# Patient Record
Sex: Female | Born: 1965 | Marital: Married | Smoking: Never smoker
Health system: Southern US, Community
[De-identification: ages and names within clinical notes are randomized; demographics above are authoritative.]

## PROBLEM LIST (undated history)

## (undated) HISTORY — PX: TOTAL HIP ARTHROPLASTY: SHX124

---

## 2018-07-13 ENCOUNTER — Other Ambulatory Visit: Payer: Self-pay | Admitting: Obstetrics and Gynecology

## 2018-07-13 DIAGNOSIS — R928 Other abnormal and inconclusive findings on diagnostic imaging of breast: Secondary | ICD-10-CM

## 2018-08-10 ENCOUNTER — Ambulatory Visit
Admission: RE | Admit: 2018-08-10 | Discharge: 2018-08-10 | Disposition: A | Discharge status: Home / Self Care | Payer: BLUE CROSS/BLUE SHIELD | Source: Ambulatory Visit | Attending: Obstetrics and Gynecology | Admitting: Obstetrics and Gynecology

## 2018-08-10 ENCOUNTER — Encounter (INDEPENDENT_AMBULATORY_CARE_PROVIDER_SITE_OTHER): Payer: Self-pay

## 2018-08-10 DIAGNOSIS — R928 Other abnormal and inconclusive findings on diagnostic imaging of breast: Secondary | ICD-10-CM

## 2018-08-16 ENCOUNTER — Encounter: Payer: Self-pay | Admitting: Registered"

## 2018-08-16 ENCOUNTER — Encounter: Payer: BLUE CROSS/BLUE SHIELD | Attending: Obstetrics and Gynecology | Admitting: Registered"

## 2018-08-16 DIAGNOSIS — R635 Abnormal weight gain: Secondary | ICD-10-CM | POA: Diagnosis present

## 2018-08-16 NOTE — Progress Notes (Signed)
Medical Nutrition Therapy:  Appt start time: 11:17 end time:  12:19.  Assessment:  Primary concerns today:  Pt expectations: direction/road map of what she should be expecting  Pt states she cannot seem to stop gaining weight and has not been able to lose weight. 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 Pt states she has been gaining around 10 lbs a year. Pt states gaining weight is making her feel depressed. Pt states she does not like the way she looks, currently uncomfortable with her physical appearance and how her body is changing. Pt states gaining weight makes her feel defeated and like the fun is over.   Pt states she has already eliminated things from her diet, reducing coffee intake, as well as alcoholic beverage intake. Pt states she has always been a healthy eater, does not eat a lot of added sugar or drink sodas. Pt states she eats fruits and vegetables all day. Pt states she does not eat out often. Pt states she prefers Ezekiel bread due to texture; other breads are like a sponge. Pt states she finds herself eating less and less meat. Pt reports she does not cook anymore due to husband still living in [place] and she's only preparing meals for herself. Pt states she is not eating things like potatoes or fried foods. Pt states she likes potato chips but does not buy them because she will indulge and eat more than half the bag.   Preferred Learning Style:   No preference indicated   Learning Readiness:   Ready  Change in progress   MEDICATIONS: See list   DIETARY INTAKE:  Usual eating pattern includes 3 meals and 2 snacks per day.  Everyday foods include salads, .  Avoided foods include processed foods, .    24-hr recall:  B ( AM): coffee with half and half + grapes  Snk ( AM): banana  L ( PM): overnight oats + almond milk + dried cranberries + chia seeds or Mexican restaurant-taco + rice + beans Snk (  PM): greek yogurt D ( PM): salad + grilled chicken + beets + goat cheese + seeds + vanaigrette + Ezekiel bread (only) + almond butter or chicken sausage + zucchini + squash Snk ( PM): none Beverages: sparkling water or plain water (40-60 oz), 1-2 wine/week  Usual physical activity: yoga,barre, body pump, spin class 4-5 times/week + hiking  Pt seems to be averaging ~1400-1600 calories based on dietary recall and needs to increase due to activity level.   Estimated energy needs: 2000-2200 calories 225-248 g carbohydrates 150-165 g protein 56-61 g fat  Progress Towards Goal(s):  In progress.   Nutritional Diagnosis:  NB-1.7 Undesireable food choices As related to balanced meals.  As evidenced by dietary recall.    Intervention:  Nutrition education and counseling. Pt was educated and counseled on benefits of having a variety of food groups throughout the day, good vs bad foods, diet culture, metabolism, the importance of fueling the body adequately, and health. Pt was also counseled on the importance of mental health and balancing stress. Pt was educated on eating disorders and how they may present themselves. Pt was in agreement with goals listed.  Goals: - Aim to have protein with fruit throughout the day. - Aim to add protein to breakfast.  - Aim to have carbohydrates, fruit/vegetables, grains, and protein with lunch and dinner.  - Aim to have increase water intake. Keep water bottle  at work visibile and sip throughout the day.  - Look into mental health resources.   Teaching Method Utilized:  Visual Auditory Hands on  Handouts given during visit include:  none  Barriers to learning/adherence to lifestyle change: contemplative stage of change  Demonstrated degree of understanding via:  Teach Back   Monitoring/Evaluation:  Dietary intake, exercise, and body weight prn.

## 2018-08-16 NOTE — Patient Instructions (Addendum)
-   Aim to have protein with fruit throughout the day.  - Aim to add protein to breakfast.   - Aim to have carbohydrates, fruit/vegetables, grains, and protein with lunch and dinner.   - Aim to have increase water intake. Keep water bottle at work visibile and sip throughout the day.   - Look into mental health resources.

## 2019-09-09 ENCOUNTER — Emergency Department (HOSPITAL_BASED_OUTPATIENT_CLINIC_OR_DEPARTMENT_OTHER): Payer: BC Managed Care – PPO

## 2019-09-09 ENCOUNTER — Other Ambulatory Visit: Payer: Self-pay

## 2019-09-09 ENCOUNTER — Encounter (HOSPITAL_BASED_OUTPATIENT_CLINIC_OR_DEPARTMENT_OTHER): Payer: Self-pay | Admitting: *Deleted

## 2019-09-09 ENCOUNTER — Emergency Department (HOSPITAL_BASED_OUTPATIENT_CLINIC_OR_DEPARTMENT_OTHER)
Admission: EM | Admit: 2019-09-09 | Discharge: 2019-09-09 | Disposition: A | Discharge status: Home / Self Care | Payer: BC Managed Care – PPO | Attending: Emergency Medicine | Admitting: Emergency Medicine

## 2019-09-09 DIAGNOSIS — Y9389 Activity, other specified: Secondary | ICD-10-CM | POA: Diagnosis not present

## 2019-09-09 DIAGNOSIS — W2201XA Walked into wall, initial encounter: Secondary | ICD-10-CM | POA: Insufficient documentation

## 2019-09-09 DIAGNOSIS — Y9289 Other specified places as the place of occurrence of the external cause: Secondary | ICD-10-CM | POA: Diagnosis not present

## 2019-09-09 DIAGNOSIS — S99921A Unspecified injury of right foot, initial encounter: Secondary | ICD-10-CM | POA: Diagnosis present

## 2019-09-09 DIAGNOSIS — S92514A Nondisplaced fracture of proximal phalanx of right lesser toe(s), initial encounter for closed fracture: Secondary | ICD-10-CM | POA: Insufficient documentation

## 2019-09-09 DIAGNOSIS — Y999 Unspecified external cause status: Secondary | ICD-10-CM | POA: Diagnosis not present

## 2019-09-09 DIAGNOSIS — S92501A Displaced unspecified fracture of right lesser toe(s), initial encounter for closed fracture: Secondary | ICD-10-CM

## 2019-09-09 DIAGNOSIS — Z79899 Other long term (current) drug therapy: Secondary | ICD-10-CM | POA: Insufficient documentation

## 2019-09-09 MED ORDER — HYDROCODONE-ACETAMINOPHEN 5-325 MG PO TABS
1.0000 | ORAL_TABLET | Freq: Once | ORAL | Status: AC
  Administered 2019-09-09: 1 via ORAL
  Filled 2019-09-09: qty 1

## 2019-09-09 NOTE — Discharge Instructions (Signed)
Please read and follow all provided instructions.  Your diagnoses today include:  1. Closed fracture of phalanx of right fifth toe, initial encounter     Tests performed today include:  An x-ray of the affected area - shows broken toe  Vital signs. See below for your results today.   Medications prescribed:  Please use over-the-counter NSAID medications (ibuprofen, naproxen) as directed on the packaging for pain.   Take any prescribed medications only as directed.  Home care instructions:   Follow any educational materials contained in this packet  Follow R.I.C.E. Protocol:  R - rest your injury   I  - use ice on injury without applying directly to skin  C - compress injury with bandage or splint  E - elevate the injury as much as possible  Follow-up instructions: Please follow-up with your primary care provider or the provided podiatrist if you have any concerns about the function of your toe after healing.  Return instructions:   Please return if your toes or feet are numb or tingling, appear gray or blue, or you have severe pain (also elevate the leg and loosen splint or wrap if you were given one)  Please return to the Emergency Department if you experience worsening symptoms.   Please return if you have any other emergent concerns.  Additional Information:  Your vital signs today were: BP (!) 146/88 (BP Location: Right Arm)   Pulse 89   Temp 99.3 F (37.4 C) (Oral)   Resp 16   Ht 5\' 7"  (1.702 m)   Wt 72.6 kg   SpO2 99%   BMI 25.06 kg/m  If your blood pressure (BP) was elevated above 135/85 this visit, please have this repeated by your doctor within one month. --------------

## 2019-09-09 NOTE — ED Provider Notes (Signed)
[facility] EMERGENCY DEPARTMENT Provider Note   CSN: [id number] Arrival date & time: 09/09/19  2051     History Chief Complaint  Patient presents with  . Toe Injury    Cynthia Preston is a 54 y.o. female.  Patient presents to the emergency department with acute onset of right fifth toe pain starting acutely just prior to arrival.  Patient states that she stubbed her toe on a wall.  She noted that her toe was displaced towards the outside of the foot.  No treatments prior to arrival other than taping.  She denies other injuries.  She has swelling of her foot that is associated with her injury.  Pain is worse with movement or palpation.        History reviewed. No pertinent past medical history.  There are no problems to display for this patient.   Past Surgical History:  Procedure Laterality Date  . TOTAL HIP ARTHROPLASTY Right      OB History   No obstetric history on file.     Family History  Problem Relation Age of Onset  . Hypertension Other     Social History   Tobacco Use  . Smoking status: Never Smoker  . Smokeless tobacco: Never Used  Substance Use Topics  . Alcohol use: Yes  . Drug use: Never    Home Medications Prior to Admission medications   Medication Sig Start Date End Date Taking? Authorizing Provider  calcium acetate (PHOSLO) 667 MG capsule Take by mouth 3 (three) times daily with meals.    [provider]  cholecalciferol (VITAMIN D3) 25 MCG (1000 UT) tablet Take 1,000 Units by mouth daily.    [provider]    Allergies    Patient has no known allergies.  Review of Systems   Review of Systems  Constitutional: Negative for activity change.  Musculoskeletal: Positive for arthralgias and joint swelling. Negative for back pain and neck pain.  Skin: Negative for wound.  Neurological: Negative for weakness and numbness.    Physical Exam Updated Vital Signs BP (!) 146/88 (BP Location: Right Arm)   Pulse 89    Temp 99.3 F (37.4 C) (Oral)   Resp 16   Ht 5\' 7"  (1.702 m)   Wt 72.6 kg   SpO2 99%   BMI 25.06 kg/m   Physical Exam Vitals and nursing note reviewed.  Constitutional:      Appearance: She is well-developed.  HENT:     Head: Normocephalic and atraumatic.  Eyes:     Pupils: Pupils are equal, round, and reactive to light.  Cardiovascular:     Pulses: Normal pulses. No decreased pulses.  Musculoskeletal:        General: Tenderness present.     Cervical back: Normal range of motion and neck supple.     Comments: Patient with tenderness and swelling of the right fifth little toe with minor swelling on the forefoot.  Toe appears to be intact vascularly with good color and perfusion.  Skin:    General: Skin is warm and dry.  Neurological:     Mental Status: She is alert.     Sensory: No sensory deficit.     Comments: Motor, sensation, and vascular distal to the injury is fully intact.      ED Results / Procedures / Treatments   Labs (all labs ordered are listed, but only abnormal results are displayed) Labs Reviewed - No data to display  EKG None  Radiology DG Foot  Complete Right  Result Date: 09/09/2019 CLINICAL DATA:  Pinky toe injury. EXAM: RIGHT FOOT COMPLETE - 3+ VIEW COMPARISON:  None. FINDINGS: There is an acute fracture involving the proximal phalanx of the fifth digit. There is surrounding soft tissue swelling. There is no dislocation. IMPRESSION: Acute fracture involving the proximal phalanx of the fifth digit. Electronically Signed   By: Constance Holster M.D.   On: 09/09/2019 21:25    Procedures Procedures (including critical care time)  Medications Ordered in ED Medications  HYDROcodone-acetaminophen (NORCO/VICODIN) 5-325 MG per tablet 1 tablet (1 tablet Oral Given 09/09/19 2200)    ED Course  I have reviewed the triage vital signs and the nursing notes.  Pertinent labs & imaging results that were available during my care of the patient were reviewed by  me and considered in my medical decision making (see chart for details).  Patient seen and examined.  X-ray reviewed.  Patient requesting pain medication.  1 Vicodin ordered for treatment in the ED.  Vital signs reviewed and are as follows: BP (!) 146/88 (BP Location: Right Arm)   Pulse 89   Temp 99.3 F (37.4 C) (Oral)   Resp 16   Ht 5\' 7"  (1.702 m)   Wt 72.6 kg   SpO2 99%   BMI 25.06 kg/m   She has a proximal phalanx fracture.  She will be buddy taped and given a hard sole shoe.  Discussed rice protocol.  Discussed podiatry follow-up if she has any issues with function after the toe is healing.     MDM Rules/Calculators/A&P                      Toe fracture, closed.  No indications for emergent orthopedic consultation.  Final Clinical Impression(s) / ED Diagnoses Final diagnoses:  Closed fracture of phalanx of right fifth toe, initial encounter    Rx / DC Orders ED Discharge Orders    None       Carlisle Cater, PA-C 09/09/19 2212    Malvin Johns, MD 09/09/19 2222

## 2019-09-09 NOTE — ED Triage Notes (Signed)
Pt states she stubbed her right pinky toe on a wall. +deformity

## 2019-12-29 IMAGING — MG DIGITAL DIAGNOSTIC UNILATERAL LEFT MAMMOGRAM WITH TOMO AND CAD
6 series · 6 of 18 positions shown · non-contrast
Comparison: 07/12/2018

CLINICAL DATA: The patient returns after baseline screening for
evaluation of a possible mass and possible asymmetry in the LEFT
breast.

EXAM:
DIGITAL DIAGNOSTIC LEFT MAMMOGRAM WITH CAD AND TOMO
ULTRASOUND LEFT BREAST

[L MLO synth-2D]
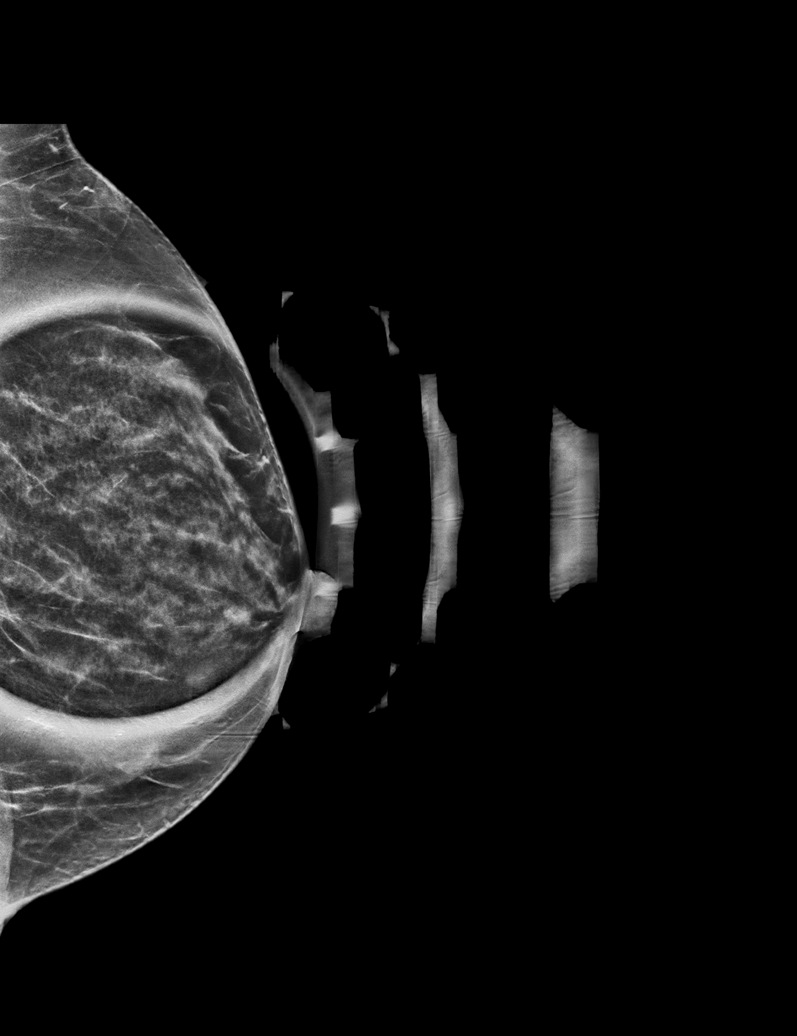

[L ML synth-2D]
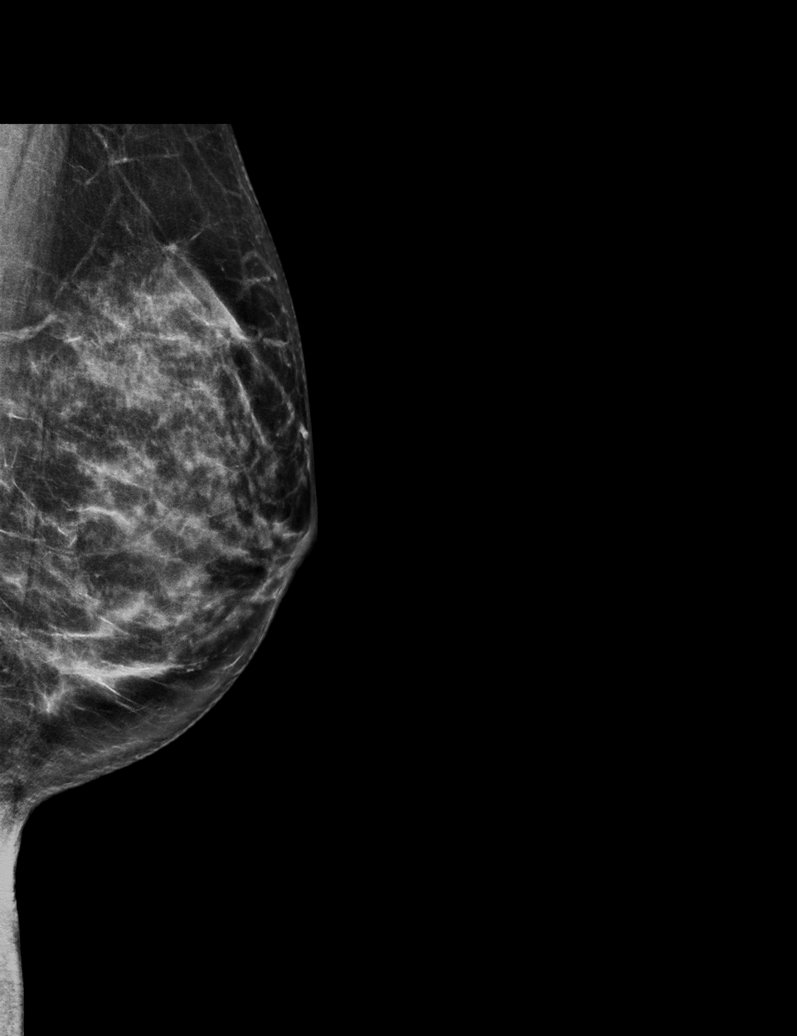

[L CC synth-2D]
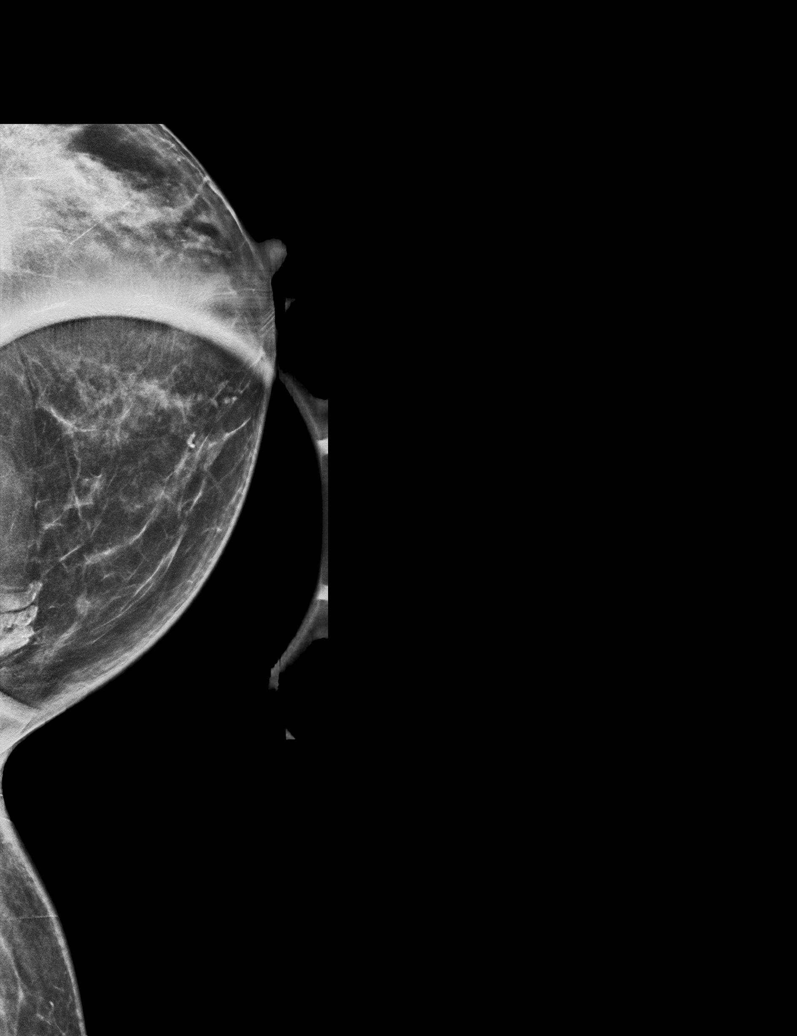

[L CC tomo · tomo slice 30/59.0]
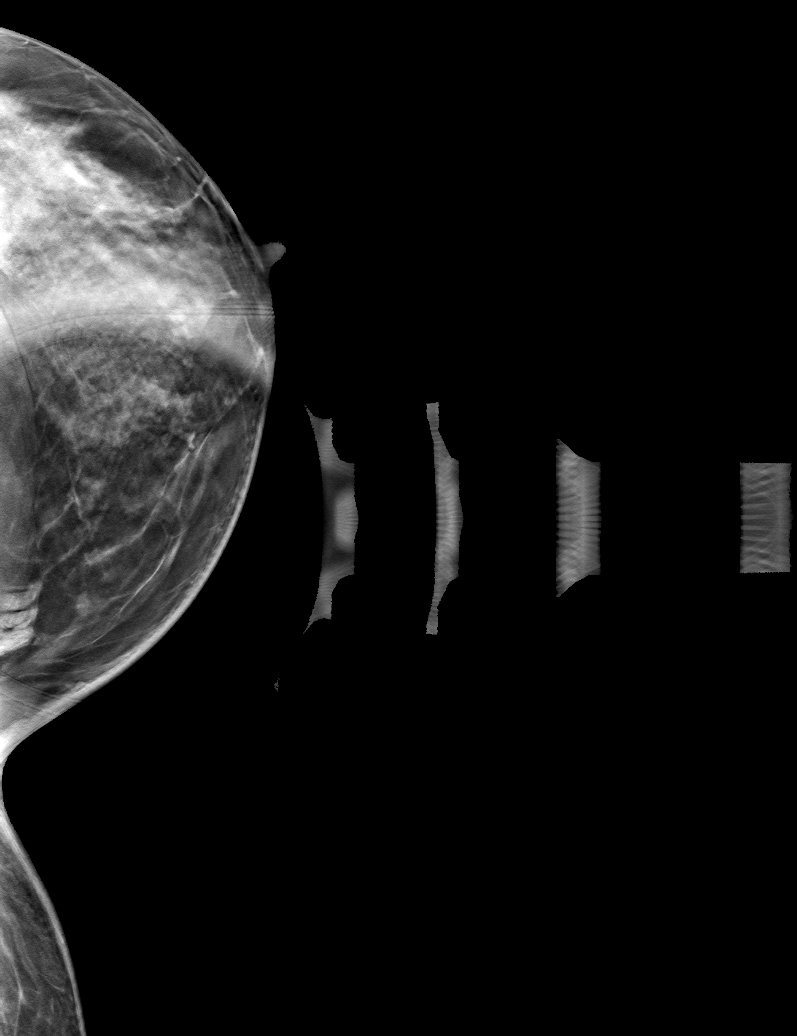

[L ML tomo · tomo slice 32/63.0]
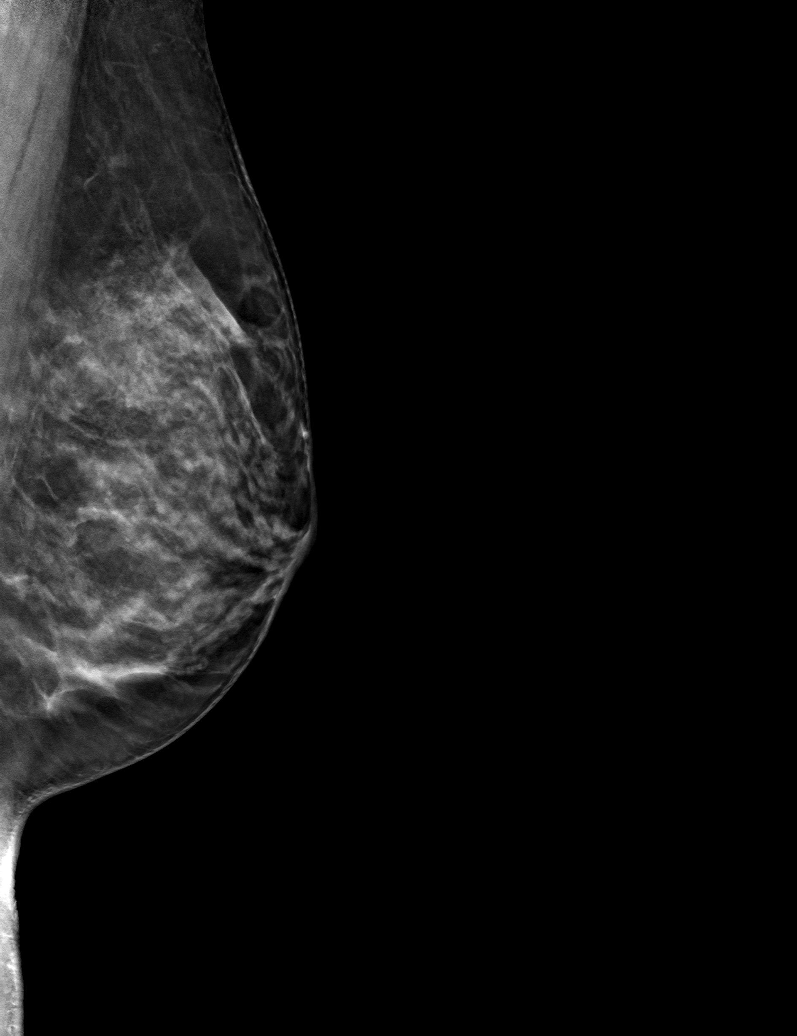

[L MLO tomo · tomo slice 27/54.0]
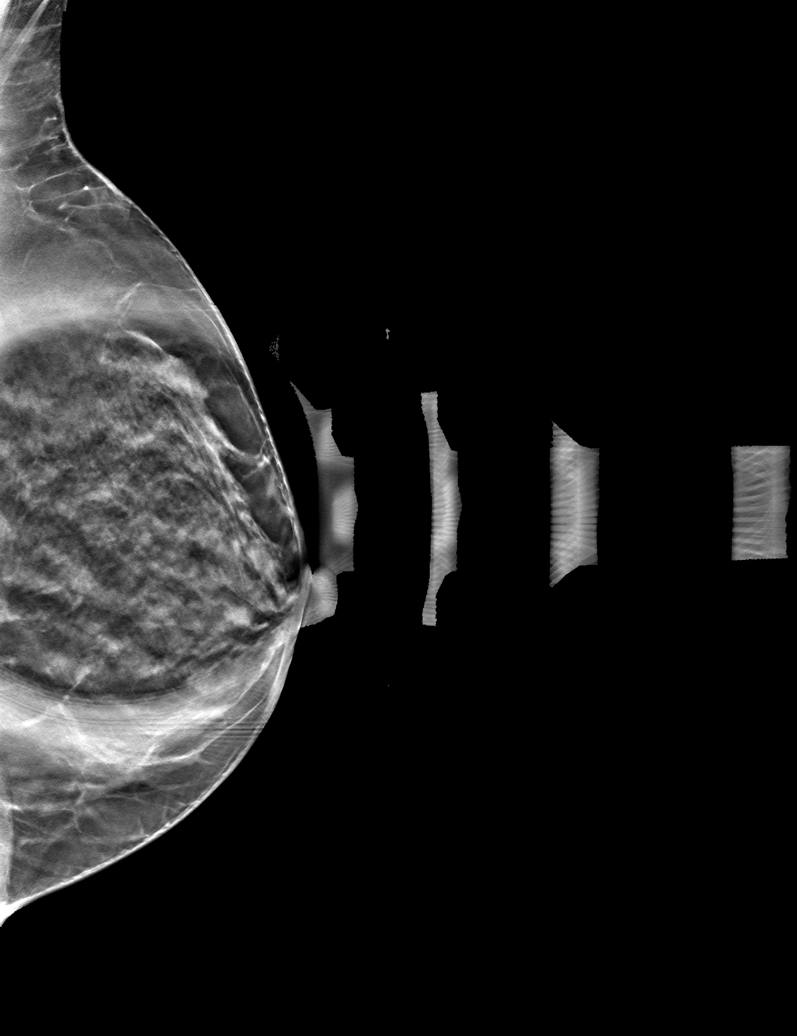

[6 of 18 positions shown; findings below may reference images not displayed]

ACR Breast Density Category c: The breast tissue is heterogeneously
dense, which may obscure small masses.
FINDINGS: Additional 2-D and 3-D images are performed. These views show no
persistent mass in the central portion of the LEFT breast.
Persistent asymmetry identified in the MEDIAL aspect of the LEFT
breast, on tomosynthesis images shown to represent muscle fibers and
interspersed fatty tissue.

Mammographic images were processed with CAD.

On physical exam, I palpate no abnormality in the MEDIAL aspect of
the LEFT breast.

Targeted ultrasound is performed, showing normal appearing
fibroglandular tissue in the MEDIAL aspect of the LEFT breast.
Muscle fibers superficial to the pectoralis are likely sternalis
muscle.
IMPRESSION: 1. No mammographic or ultrasound evidence for malignancy.
2. Sternalis muscle accounts for the asymmetry in the MEDIAL LEFT
breast.

RECOMMENDATION:
Screening mammogram in one year.(Code:[id number]-G3Y)

I have discussed the findings and recommendations with the patient.
Results were also provided in writing at the conclusion of the
visit. If applicable, a reminder letter will be sent to the patient
regarding the next appointment.

BI-RADS CATEGORY  1: Negative.

## 2020-08-26 DIAGNOSIS — L089 Local infection of the skin and subcutaneous tissue, unspecified: Secondary | ICD-10-CM | POA: Diagnosis not present

## 2020-08-26 DIAGNOSIS — L723 Sebaceous cyst: Secondary | ICD-10-CM | POA: Diagnosis not present

## 2020-08-30 DIAGNOSIS — L089 Local infection of the skin and subcutaneous tissue, unspecified: Secondary | ICD-10-CM | POA: Diagnosis not present

## 2020-08-30 DIAGNOSIS — L723 Sebaceous cyst: Secondary | ICD-10-CM | POA: Diagnosis not present

## 2020-10-01 DIAGNOSIS — L723 Sebaceous cyst: Secondary | ICD-10-CM | POA: Diagnosis not present

## 2020-10-02 DIAGNOSIS — E610 Copper deficiency: Secondary | ICD-10-CM | POA: Diagnosis not present

## 2020-10-02 DIAGNOSIS — Z1329 Encounter for screening for other suspected endocrine disorder: Secondary | ICD-10-CM | POA: Diagnosis not present

## 2020-10-02 DIAGNOSIS — R5383 Other fatigue: Secondary | ICD-10-CM | POA: Diagnosis not present

## 2020-10-02 DIAGNOSIS — E612 Magnesium deficiency: Secondary | ICD-10-CM | POA: Diagnosis not present

## 2020-10-02 DIAGNOSIS — E538 Deficiency of other specified B group vitamins: Secondary | ICD-10-CM | POA: Diagnosis not present

## 2020-10-02 DIAGNOSIS — R635 Abnormal weight gain: Secondary | ICD-10-CM | POA: Diagnosis not present

## 2020-10-02 DIAGNOSIS — N951 Menopausal and female climacteric states: Secondary | ICD-10-CM | POA: Diagnosis not present

## 2020-10-02 DIAGNOSIS — E559 Vitamin D deficiency, unspecified: Secondary | ICD-10-CM | POA: Diagnosis not present

## 2020-10-02 DIAGNOSIS — E60 Dietary zinc deficiency: Secondary | ICD-10-CM | POA: Diagnosis not present

## 2020-10-02 DIAGNOSIS — L409 Psoriasis, unspecified: Secondary | ICD-10-CM | POA: Diagnosis not present

## 2020-10-30 ENCOUNTER — Other Ambulatory Visit: Payer: Self-pay

## 2020-10-30 ENCOUNTER — Ambulatory Visit: Payer: No Typology Code available for payment source | Admitting: Plastic Surgery

## 2020-10-30 ENCOUNTER — Encounter: Payer: Self-pay | Admitting: Plastic Surgery

## 2020-10-30 VITALS — BP 127/77 | HR 74 | Ht 67.0 in | Wt 170.4 lb

## 2020-10-30 DIAGNOSIS — L723 Sebaceous cyst: Secondary | ICD-10-CM | POA: Diagnosis not present

## 2020-10-30 NOTE — Progress Notes (Signed)
   Referring Provider Tyson Dense, [address] [place],  [place] [address]   CC:  Chief Complaint  Patient presents with  . consult      Cynthia Preston is an 55 y.o. female.  HPI: Patient presents to discuss several cystic lesions on the outer aspect of her left knee.  These been present for a number of years.  At least 5 years she says.  Over a month ago she noticed swelling in that area and several of the cysts were large in size.  They ultimately drained purulent material and have been drained twice by her dermatologist.  She has been on antibiotic therapy but is completed it.  She feels like there gradually decreasing inflammation but is interested in having them removed.  They are still a little bit tender for her.  No Known Allergies  Outpatient Encounter Medications as of 10/30/2020  Medication Sig  . CALCIUM PO Take by mouth.  . cholecalciferol (VITAMIN D3) 25 MCG (1000 UT) tablet Take 1,000 Units by mouth daily.  . calcium acetate (PHOSLO) 667 MG capsule Take by mouth 3 (three) times daily with meals.   No facility-administered encounter medications on file as of 10/30/2020.     No past medical history on file.  Past Surgical History:  Procedure Laterality Date  . TOTAL HIP ARTHROPLASTY Right     Family History  Problem Relation Age of Onset  . Hypertension Other     Social History   Social History Narrative  . Not on file     Review of Systems General: Denies fevers, chills, weight loss CV: Denies chest pain, shortness of breath, palpitations  Physical Exam Vitals with BMI 10/30/2020 09/09/2019  Height 5\' 7"  5\' 7"   Weight 170 lbs 6 oz 160 lbs  BMI 18.84 16.60  Systolic 630 160  Diastolic 77 88  Pulse 74 89    General:  No acute distress,  Alert and oriented, Non-Toxic, Normal speech and affect Examination of the left knee shows for 5 separate cystic lesions.  3 of these appear to be previously inflamed and are subsiding.  2 other  adjacent ones look to be a typical cyst with a subcutaneous mass associated with a punctum on the skin surface.  I do not see any fluctuance or erythema at this time to suggest acute infection.  Assessment/Plan Patient presents with several symptomatic cystic lesions in the lateral aspect of her left knee.  We discussed excision.  We discussed the risks include bleeding, infection, damage to surrounding structures need for additional procedures.  I discussed the potential for recurrence.  I discussed that I may not be able to remove all of these at one time given their distribution but I should be able to get at least 3 other for them out.  I discussed the location and orientation of the scars in detail.  All her questions were answered we will plan to move forward to this under local.  [name] 10/30/2020, 2:25 PM

## 2020-11-01 DIAGNOSIS — L409 Psoriasis, unspecified: Secondary | ICD-10-CM | POA: Diagnosis not present

## 2020-11-01 DIAGNOSIS — E538 Deficiency of other specified B group vitamins: Secondary | ICD-10-CM | POA: Diagnosis not present

## 2020-11-01 DIAGNOSIS — R5383 Other fatigue: Secondary | ICD-10-CM | POA: Diagnosis not present

## 2020-11-01 DIAGNOSIS — R635 Abnormal weight gain: Secondary | ICD-10-CM | POA: Diagnosis not present

## 2020-11-01 DIAGNOSIS — N951 Menopausal and female climacteric states: Secondary | ICD-10-CM | POA: Diagnosis not present

## 2020-11-01 DIAGNOSIS — E559 Vitamin D deficiency, unspecified: Secondary | ICD-10-CM | POA: Diagnosis not present

## 2020-12-18 ENCOUNTER — Ambulatory Visit: Payer: No Typology Code available for payment source | Admitting: Plastic Surgery

## 2020-12-18 ENCOUNTER — Other Ambulatory Visit: Payer: Self-pay

## 2020-12-18 ENCOUNTER — Encounter: Payer: Self-pay | Admitting: Plastic Surgery

## 2020-12-18 ENCOUNTER — Other Ambulatory Visit (HOSPITAL_COMMUNITY)
Admission: RE | Admit: 2020-12-18 | Discharge: 2020-12-18 | Disposition: A | Discharge status: Home / Self Care | Payer: No Typology Code available for payment source | Source: Ambulatory Visit | Attending: Plastic Surgery | Admitting: Plastic Surgery

## 2020-12-18 VITALS — BP 120/69 | HR 74

## 2020-12-18 DIAGNOSIS — L723 Sebaceous cyst: Secondary | ICD-10-CM | POA: Diagnosis not present

## 2020-12-18 DIAGNOSIS — L720 Epidermal cyst: Secondary | ICD-10-CM | POA: Diagnosis not present

## 2020-12-18 MED ORDER — HYDROCODONE-ACETAMINOPHEN 5-325 MG PO TABS: 1.0000 | ORAL_TABLET | Freq: Four times a day (QID) | ORAL | 0 refills | Status: DC | PRN

## 2020-12-18 NOTE — Progress Notes (Signed)
Operative Note   DATE OF OPERATION: 12/18/2020  LOCATION:    SURGICAL DEPARTMENT: Plastic Surgery  PREOPERATIVE DIAGNOSES: Left knee cysts  POSTOPERATIVE DIAGNOSES:  same  PROCEDURE:  1. Excision of left knee cysts measuring 3 cm and 2 cm respectively 2. Complex closure measuring 5 cm  SURGEON: Talmadge Coventry, MD  ANESTHESIA:  Local  COMPLICATIONS: None.   INDICATIONS FOR PROCEDURE:  The patient, Cynthia Preston is a 55 y.o. female born on 1965/11/09, is here for treatment of left knee cysts MRN: [id number]  CONSENT:  Informed consent was obtained directly from the patient. Risks, benefits and alternatives were fully discussed. Specific risks including but not limited to bleeding, infection, hematoma, seroma, scarring, pain, infection, wound healing problems, and need for further surgery were all discussed. The patient did have an ample opportunity to have questions answered to satisfaction.   DESCRIPTION OF PROCEDURE:  Local anesthesia was administered. The patient's operative site was prepped and draped in a sterile fashion. A time out was performed and all information was confirmed to be correct.  The lesion was excised with a 15 blade.  Hemostasis was obtained.  Circumferential undermining was performed and the skin was advanced and closed in layers with interrupted buried Monocryl sutures and 3-0 Monocryl for the skin.    The patient tolerated the procedure well.  There were no complications.

## 2020-12-23 LAB — SURGICAL PATHOLOGY

## 2021-01-01 ENCOUNTER — Other Ambulatory Visit: Payer: Self-pay

## 2021-01-01 ENCOUNTER — Ambulatory Visit: Payer: No Typology Code available for payment source | Admitting: Plastic Surgery

## 2021-01-01 DIAGNOSIS — L723 Sebaceous cyst: Secondary | ICD-10-CM | POA: Diagnosis not present

## 2021-01-01 NOTE — Progress Notes (Signed)
Patient presents about 2 weeks postop from excision of left knee cyst.  Path showed benign epidermal inclusion cyst.  She initially had some pain but it since tapered off and become a bit more tolerable.  On exam there is a little bit of suture line erythema but there does not appear to be any signs of infection.  There is no purulence.  The suture lines are intact.  I did remove the Monocryl sutures and redressed the wound today.  Of asked her to try to avoid real strenuous activity for another week or 2 and at that point she could resume without any restrictions.  All of her questions were answered we will plan to see her again on an as-needed basis.

## 2021-01-27 IMAGING — DX DG FOOT COMPLETE 3+V*R*
3 series · 3 of 3 positions shown · non-contrast
Comparison: None.

CLINICAL DATA: Pinky toe injury.

EXAM:
RIGHT FOOT COMPLETE - 3+ VIEW

[foot ap]
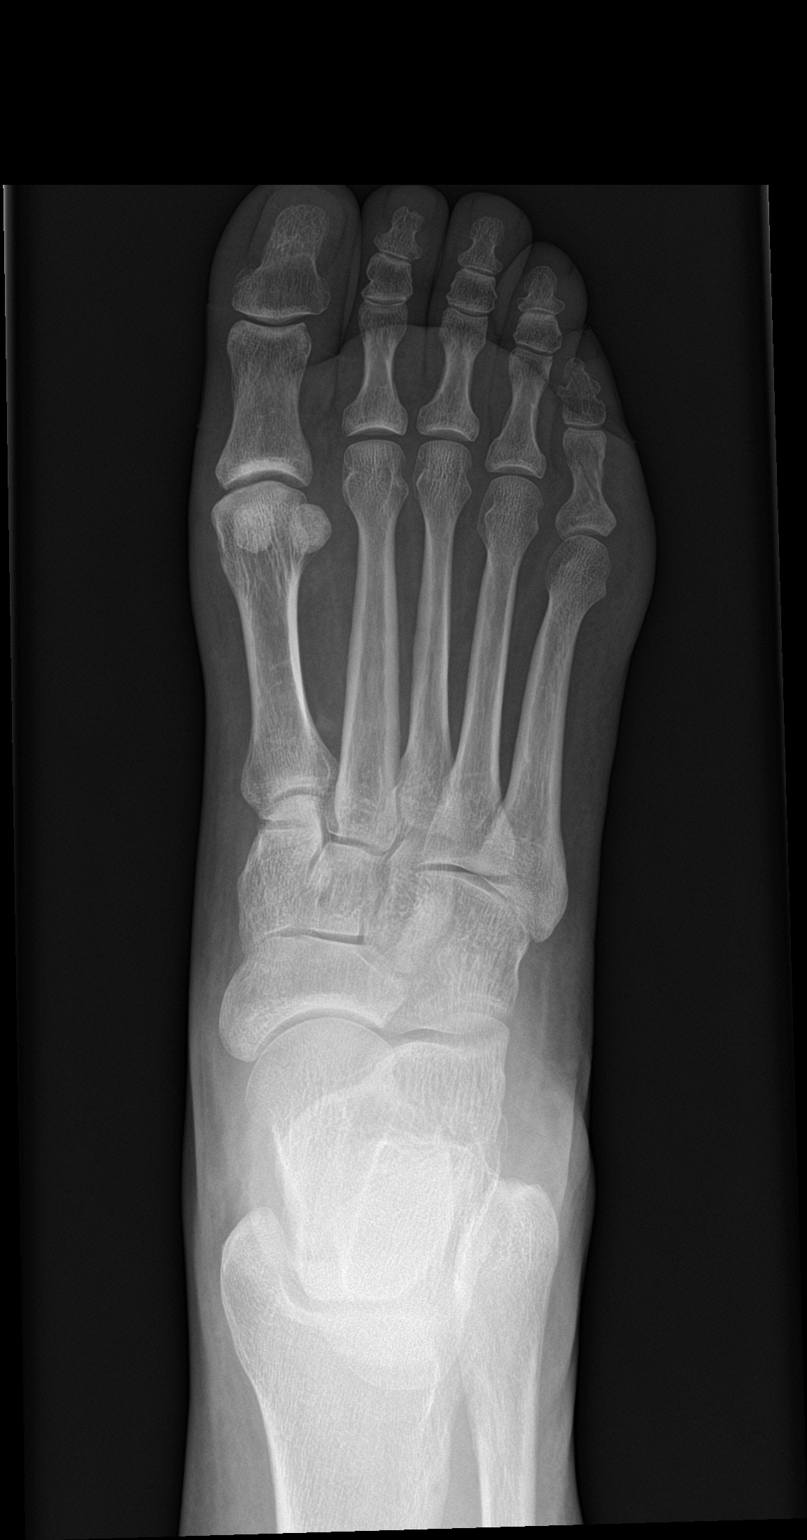

[foot obl]
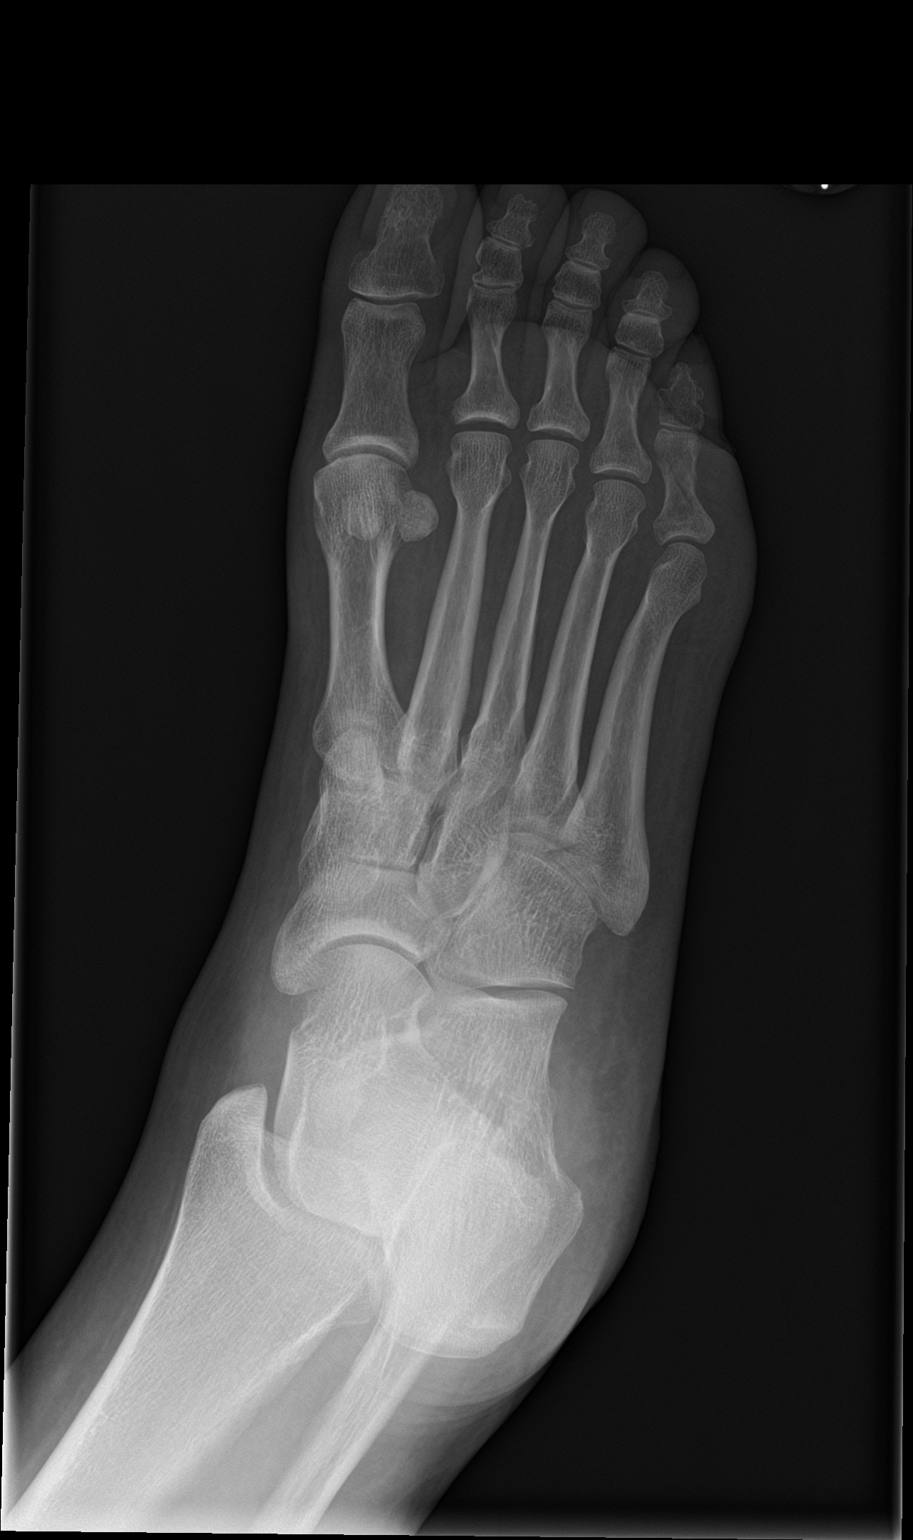

[foot lat]
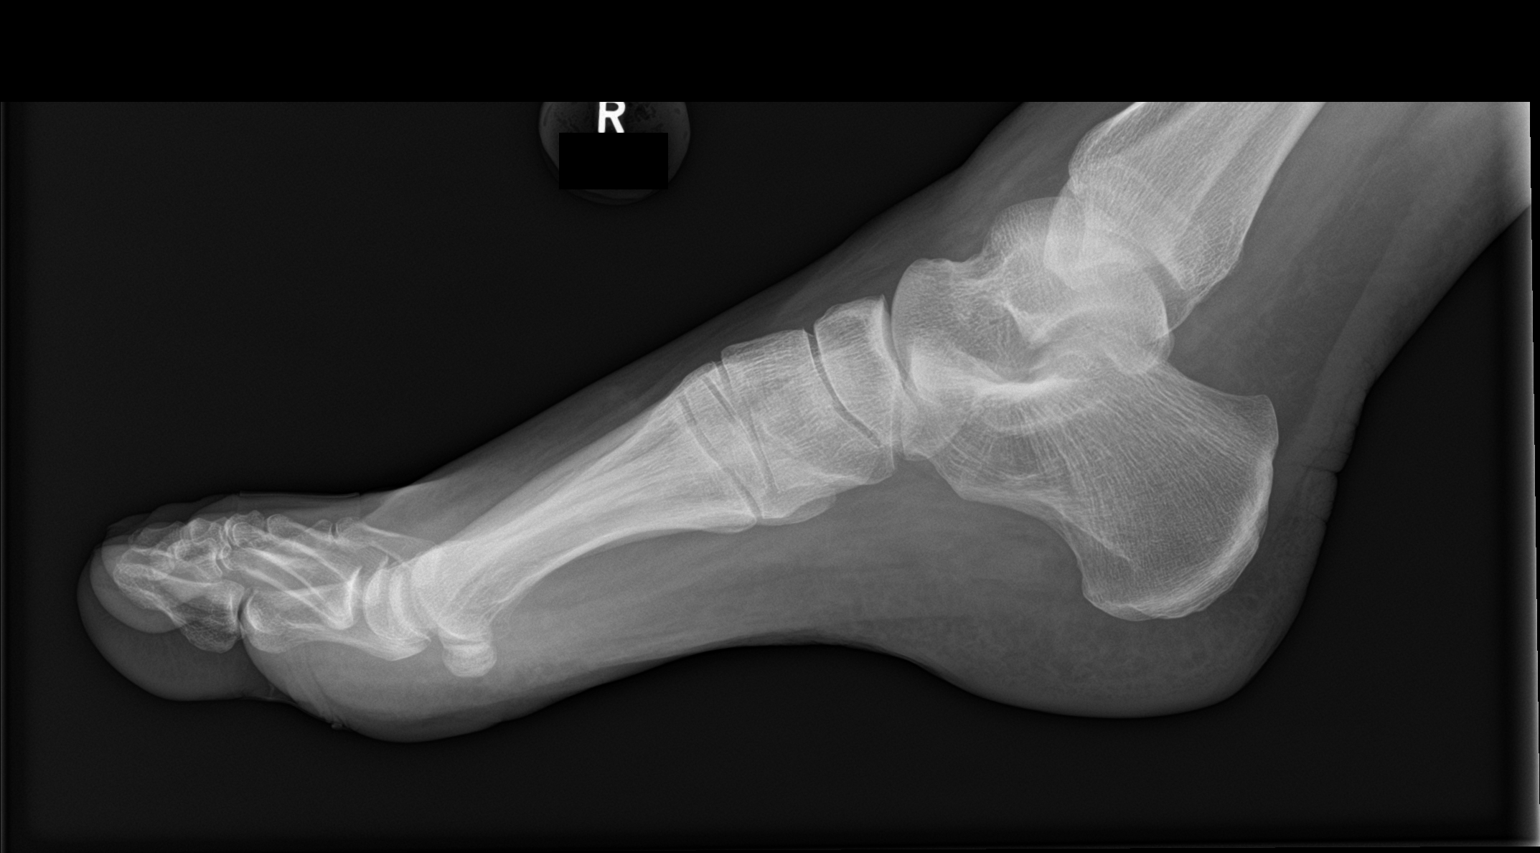

[3 of 3 positions shown; findings below may reference images not displayed]

FINDINGS: There is an acute fracture involving the proximal phalanx of the
fifth digit. There is surrounding soft tissue swelling. There is no
dislocation.
IMPRESSION: Acute fracture involving the proximal phalanx of the fifth digit.

## 2021-12-15 DIAGNOSIS — L723 Sebaceous cyst: Secondary | ICD-10-CM | POA: Diagnosis not present

## 2021-12-15 DIAGNOSIS — D225 Melanocytic nevi of trunk: Secondary | ICD-10-CM | POA: Diagnosis not present

## 2021-12-15 DIAGNOSIS — D2261 Melanocytic nevi of right upper limb, including shoulder: Secondary | ICD-10-CM | POA: Diagnosis not present

## 2021-12-15 DIAGNOSIS — D2262 Melanocytic nevi of left upper limb, including shoulder: Secondary | ICD-10-CM | POA: Diagnosis not present

## 2021-12-15 DIAGNOSIS — L578 Other skin changes due to chronic exposure to nonionizing radiation: Secondary | ICD-10-CM | POA: Diagnosis not present

## 2021-12-15 DIAGNOSIS — L409 Psoriasis, unspecified: Secondary | ICD-10-CM | POA: Diagnosis not present

## 2021-12-15 DIAGNOSIS — L821 Other seborrheic keratosis: Secondary | ICD-10-CM | POA: Diagnosis not present

## 2021-12-15 DIAGNOSIS — L814 Other melanin hyperpigmentation: Secondary | ICD-10-CM | POA: Diagnosis not present

## 2022-02-07 ENCOUNTER — Telehealth: Payer: Self-pay | Admitting: Plastic Surgery

## 2022-02-07 NOTE — Telephone Encounter (Signed)
Duplicate

## 2022-02-11 ENCOUNTER — Ambulatory Visit (INDEPENDENT_AMBULATORY_CARE_PROVIDER_SITE_OTHER): Payer: 59 | Admitting: Physician Assistant

## 2022-02-11 ENCOUNTER — Encounter: Payer: Self-pay | Admitting: Physician Assistant

## 2022-02-11 DIAGNOSIS — L723 Sebaceous cyst: Secondary | ICD-10-CM | POA: Diagnosis not present

## 2022-02-11 NOTE — Progress Notes (Signed)
   Referring Provider Tyson Dense, [address] [place],  [place] [address]   CC:  Chief Complaint  Patient presents with   Follow-up      Cynthia Preston is an 56 y.o. female.   HPI:   The 56 year old female seen in our office for follow-up evaluation.  She is status post excision of ganglion cyst on 12/18/2020 by Dr. Claudia Desanctis.  She notes that prior to excision she did have an infection and rupture of one of the cysts.  At that time she had several cyst removed.  She notes that she had done very well until approximate 4 months ago when she noticed along one of her incision lines a cyst returning.  She notes this is steadily been getting larger, it is tender.  She denies any infectious symptoms including surrounding redness warmth nor fever.  She notes overall no significant changes to health history, she denies any use of antiplatelets or anticoagulants.  She reports she would like the cyst removed.  No Known Allergies  Outpatient Encounter Medications as of 02/11/2022  Medication Sig   calcium acetate (PHOSLO) 667 MG capsule Take 667 mg by mouth daily.   CALCIUM PO Take by mouth.   cholecalciferol (VITAMIN D3) 25 MCG (1000 UT) tablet Take 1,000 Units by mouth daily.   [DISCONTINUED] HYDROcodone-acetaminophen (NORCO) 5-325 MG tablet Take 1 tablet by mouth every 6 (six) hours as needed for moderate pain.   No facility-administered encounter medications on file as of 02/11/2022.     No past medical history on file.  Past Surgical History:  Procedure Laterality Date   TOTAL HIP ARTHROPLASTY Right     Family History  Problem Relation Age of Onset   Hypertension Other     Social History   Social History Narrative   Not on file     Review of Systems General: Denies fevers or chills  Physical Exam    12/18/2020    1:22 PM 10/30/2020   11:52 AM 09/09/2019    9:04 PM  Vitals with BMI  Height  '5\' 7"'$    Weight  170 lbs 6 oz   BMI  19.14   Systolic 782 956  213  Diastolic 69 77 88  Pulse 74 74 89    General:  No acute distress, nontoxic appearing  Respiratory: No increased work of breathing Neuro: Alert and oriented Psychiatric: Normal mood and affect  Integumentary: 1 cm cyst left lateral knee Musculoskeletal: full active ROM of knee    Assessment/Plan  This is a pleasant 56 year old female seen in our office for follow-up evaluation of a likely sebaceous cyst.  The patient has a history of the same, she has 2 small incisions along the left lateral knee.  It does appear that she has developed another cyst at the more superior incision.  She notes this is tender, it is getting larger, and is aesthetically unpleasing to her.  Given her significant past history with infection of previous cyst she would like this removed.  She is otherwise healthy and denies any significant changes to her health history since her last procedure.  She is not on any antiplatelets or anticoagulants.  She would like to schedule excision with the next available physician.  At this time I will send a message to our staff in hopes of getting her scheduled.  Stevie Kern Shany Marinez 02/11/2022, 11:12 AM

## 2022-04-09 ENCOUNTER — Encounter: Payer: Self-pay | Admitting: Plastic Surgery

## 2022-04-09 ENCOUNTER — Ambulatory Visit (INDEPENDENT_AMBULATORY_CARE_PROVIDER_SITE_OTHER): Payer: 59 | Admitting: Plastic Surgery

## 2022-04-09 VITALS — BP 125/77 | HR 73

## 2022-04-09 DIAGNOSIS — L723 Sebaceous cyst: Secondary | ICD-10-CM | POA: Diagnosis not present

## 2022-04-10 NOTE — Progress Notes (Signed)
   Referring Provider Tyson Dense, MD [address] [place],  [place] [address]   CC:  Sebaceous cyst lateral to the left knee  Dorie Ohms is an 56 y.o. female.  HPI: Patient is a 56 year old with a sebaceous cyst lateral left knee.  It was removed with a area has returned and appears cystic again.  Review of Systems General: No fever or chills.  Physical Exam    04/09/2022    8:41 AM 12/18/2020    1:22 PM 10/30/2020   11:52 AM  Vitals with BMI  Height   '5\' 7"'$   Weight   170 lbs 6 oz  BMI   12.19  Systolic 758 832 549  Diastolic 77 69 77  Pulse 73 74 74    General:  No acute distress,  Alert and oriented, Non-Toxic, Normal speech and affect Skin: 1 cm cystic lesion lateral to left knee.  Gross appearance of sebaceous cyst.  Assessment/Plan We will plan reexcision of sebaceous cyst lateral to left knee.  I explained to the patient that when these are ruptured sometimes it is difficult to determine if all capsule material is removed and they can recur.  Lennice Sites 04/10/2022, 4:27 AM

## 2022-05-07 ENCOUNTER — Ambulatory Visit: Payer: 59 | Admitting: Plastic Surgery

## 2022-05-07 ENCOUNTER — Encounter: Payer: Self-pay | Admitting: Plastic Surgery

## 2022-05-07 VITALS — BP 123/77 | HR 68

## 2022-05-07 DIAGNOSIS — L729 Follicular cyst of the skin and subcutaneous tissue, unspecified: Secondary | ICD-10-CM | POA: Diagnosis not present

## 2022-05-07 DIAGNOSIS — D489 Neoplasm of uncertain behavior, unspecified: Secondary | ICD-10-CM

## 2022-05-07 DIAGNOSIS — L720 Epidermal cyst: Secondary | ICD-10-CM | POA: Diagnosis not present

## 2022-05-07 MED ORDER — OXYCODONE HCL 5 MG PO TABS: 5.0000 mg | ORAL_TABLET | Freq: Three times a day (TID) | ORAL | 0 refills | Status: AC | PRN

## 2022-05-07 NOTE — Progress Notes (Signed)
Operative Note   DATE OF OPERATION: 05/07/2022  LOCATION:   Office  SURGICAL DEPARTMENT: Plastic Surgery  PREOPERATIVE DIAGNOSES: Left knee cystic lesion  POSTOPERATIVE DIAGNOSES:  same  PROCEDURE:  Excision of left knee lesion measuring 2 cm Intermediate closure measuring 2.1 cm  SURGEON: Haidyn Kilburg P. [place], MD  ANESTHESIA:  Local  COMPLICATIONS: None.   INDICATIONS FOR PROCEDURE:  The patient, Cynthia Preston is a 56 y.o. female born on 11/13/1965, is here for treatment of left knee cystic lesion. MRN: [id number]  CONSENT:  Informed consent was obtained directly from the patient. Risks, benefits and alternatives were fully discussed. Specific risks including but not limited to bleeding, infection, hematoma, seroma, scarring, pain, infection, wound healing problems, and need for further surgery were all discussed. The patient did have an ample opportunity to have questions answered to satisfaction.   DESCRIPTION OF PROCEDURE:  Local anesthesia was administered. The patient's operative site was prepped and draped in a sterile fashion. A time out was performed and all information was confirmed to be correct.  The lesion was excised with a 15 blade.  Hemostasis was obtained.  Circumferential undermining was performed and the skin was advanced and closed in layers with interrupted buried PDS sutures and 3-0 Prolene for the skin.  The lesion excised measured 2 cm, and the total length of closure measured 2.1 cm.    The patient tolerated the procedure well.  There were no complications.

## 2022-05-26 ENCOUNTER — Ambulatory Visit: Payer: 59 | Admitting: Surgical

## 2022-05-26 DIAGNOSIS — L723 Sebaceous cyst: Secondary | ICD-10-CM

## 2022-05-26 DIAGNOSIS — L720 Epidermal cyst: Secondary | ICD-10-CM

## 2022-05-26 NOTE — Progress Notes (Signed)
56 year old female here for follow-up after excision of left knee cystic lesion with Dr. Erin Hearing on 05/07/2022.  She is just shy of 3 weeks post procedure.  The skin was closed with 3-0 Prolene.  Pathology showed epidermoid cyst.  Patient reports she is doing well.  She has not had any issues.  She reviewed her pathology report via [place].  We also reviewed pathology report today.  On exam left knee incision is intact, healing well, Prolene sutures are in place.  These were removed.  Incision is CDI.  There is no erythema or cellulitic change.  No subcutaneous fluid collection noted.  Recommend avoiding any compound lower extremity exercises for 2 more weeks, she can begin cycling in 2 weeks.  Upper body exercises and hiking/walking is fine.  Recommend following up as needed.  Call with questions or concerns.  Pictures were placed in the patient's chart with her permission.

## 2022-12-23 DIAGNOSIS — L578 Other skin changes due to chronic exposure to nonionizing radiation: Secondary | ICD-10-CM | POA: Diagnosis not present

## 2022-12-23 DIAGNOSIS — D225 Melanocytic nevi of trunk: Secondary | ICD-10-CM | POA: Diagnosis not present

## 2022-12-23 DIAGNOSIS — L814 Other melanin hyperpigmentation: Secondary | ICD-10-CM | POA: Diagnosis not present

## 2022-12-23 DIAGNOSIS — D2230 Melanocytic nevi of unspecified part of face: Secondary | ICD-10-CM | POA: Diagnosis not present

## 2022-12-23 DIAGNOSIS — D2262 Melanocytic nevi of left upper limb, including shoulder: Secondary | ICD-10-CM | POA: Diagnosis not present

## 2022-12-23 DIAGNOSIS — L821 Other seborrheic keratosis: Secondary | ICD-10-CM | POA: Diagnosis not present

## 2022-12-23 DIAGNOSIS — L409 Psoriasis, unspecified: Secondary | ICD-10-CM | POA: Diagnosis not present

## 2022-12-23 DIAGNOSIS — L304 Erythema intertrigo: Secondary | ICD-10-CM | POA: Diagnosis not present

## 2022-12-23 DIAGNOSIS — D2261 Melanocytic nevi of right upper limb, including shoulder: Secondary | ICD-10-CM | POA: Diagnosis not present

## 2023-04-21 DIAGNOSIS — Z01419 Encounter for gynecological examination (general) (routine) without abnormal findings: Secondary | ICD-10-CM | POA: Diagnosis not present

## 2023-04-21 DIAGNOSIS — Z3202 Encounter for pregnancy test, result negative: Secondary | ICD-10-CM | POA: Diagnosis not present

## 2023-04-21 DIAGNOSIS — N939 Abnormal uterine and vaginal bleeding, unspecified: Secondary | ICD-10-CM | POA: Diagnosis not present

## 2023-04-21 DIAGNOSIS — R319 Hematuria, unspecified: Secondary | ICD-10-CM | POA: Diagnosis not present

## 2023-04-21 DIAGNOSIS — E02 Subclinical iodine-deficiency hypothyroidism: Secondary | ICD-10-CM | POA: Diagnosis not present

## 2023-04-21 DIAGNOSIS — Z1321 Encounter for screening for nutritional disorder: Secondary | ICD-10-CM | POA: Diagnosis not present

## 2023-04-21 DIAGNOSIS — E785 Hyperlipidemia, unspecified: Secondary | ICD-10-CM | POA: Diagnosis not present

## 2023-04-21 DIAGNOSIS — Z1231 Encounter for screening mammogram for malignant neoplasm of breast: Secondary | ICD-10-CM | POA: Diagnosis not present

## 2023-04-21 DIAGNOSIS — E559 Vitamin D deficiency, unspecified: Secondary | ICD-10-CM | POA: Diagnosis not present

## 2023-04-21 DIAGNOSIS — Z6825 Body mass index (BMI) 25.0-25.9, adult: Secondary | ICD-10-CM | POA: Diagnosis not present

## 2023-04-21 DIAGNOSIS — Z13220 Encounter for screening for lipoid disorders: Secondary | ICD-10-CM | POA: Diagnosis not present

## 2023-04-21 DIAGNOSIS — Z1329 Encounter for screening for other suspected endocrine disorder: Secondary | ICD-10-CM | POA: Diagnosis not present

## 2023-04-21 DIAGNOSIS — Z3040 Encounter for surveillance of contraceptives, unspecified: Secondary | ICD-10-CM | POA: Diagnosis not present

## 2023-04-21 DIAGNOSIS — Z131 Encounter for screening for diabetes mellitus: Secondary | ICD-10-CM | POA: Diagnosis not present

## 2023-04-21 DIAGNOSIS — D259 Leiomyoma of uterus, unspecified: Secondary | ICD-10-CM | POA: Diagnosis not present

## 2023-05-07 DIAGNOSIS — Z1211 Encounter for screening for malignant neoplasm of colon: Secondary | ICD-10-CM | POA: Diagnosis not present

## 2023-05-21 DIAGNOSIS — N939 Abnormal uterine and vaginal bleeding, unspecified: Secondary | ICD-10-CM | POA: Diagnosis not present
